# Patient Record
Sex: Male | Born: 1980 | Hispanic: Yes | Marital: Married
Health system: Southern US, Community
[De-identification: ages and names within clinical notes are randomized; demographics above are authoritative.]

---

## 2019-09-12 ENCOUNTER — Ambulatory Visit: Payer: Self-pay | Attending: Internal Medicine

## 2019-09-12 DIAGNOSIS — Z20822 Contact with and (suspected) exposure to covid-19: Secondary | ICD-10-CM | POA: Insufficient documentation

## 2019-09-13 LAB — NOVEL CORONAVIRUS, NAA: SARS-CoV-2, NAA: NOT DETECTED

## 2019-09-13 LAB — SARS-COV-2, NAA 2 DAY TAT

## 2020-10-08 ENCOUNTER — Other Ambulatory Visit: Payer: Self-pay

## 2020-10-08 ENCOUNTER — Emergency Department (HOSPITAL_COMMUNITY): Payer: BC Managed Care – PPO

## 2020-10-08 ENCOUNTER — Emergency Department (HOSPITAL_COMMUNITY)
Admission: EM | Admit: 2020-10-08 | Discharge: 2020-10-08 | Disposition: A | Discharge status: Home / Self Care | Payer: BC Managed Care – PPO | Attending: Emergency Medicine | Admitting: Emergency Medicine

## 2020-10-08 ENCOUNTER — Encounter (HOSPITAL_COMMUNITY): Payer: Self-pay | Admitting: *Deleted

## 2020-10-08 DIAGNOSIS — R300 Dysuria: Secondary | ICD-10-CM | POA: Diagnosis present

## 2020-10-08 DIAGNOSIS — N1330 Unspecified hydronephrosis: Secondary | ICD-10-CM | POA: Insufficient documentation

## 2020-10-08 DIAGNOSIS — N200 Calculus of kidney: Secondary | ICD-10-CM | POA: Diagnosis not present

## 2020-10-08 LAB — COMPREHENSIVE METABOLIC PANEL
ALT: 32 U/L (ref 0–44)
AST: 20 U/L (ref 15–41)
Albumin: 4.3 g/dL (ref 3.5–5.0)
Alkaline Phosphatase: 81 U/L (ref 38–126)
Anion gap: 7 (ref 5–15)
BUN: 19 mg/dL (ref 6–20)
CO2: 25 mmol/L (ref 22–32)
Calcium: 9.3 mg/dL (ref 8.9–10.3)
Chloride: 105 mmol/L (ref 98–111)
Creatinine, Ser: 0.96 mg/dL (ref 0.61–1.24)
GFR, Estimated: >60 mL/min (ref >60)
Glucose, Bld: 113 mg/dL — ABNORMAL HIGH (ref 70–99)
Potassium: 3.9 mmol/L (ref 3.5–5.1)
Sodium: 137 mmol/L (ref 135–145)
Total Bilirubin: 0.8 mg/dL (ref 0.3–1.2)
Total Protein: 7.2 g/dL (ref 6.5–8.1)

## 2020-10-08 LAB — CBC
HCT: 46.3 % (ref 39.0–52.0)
Hemoglobin: 15.5 g/dL (ref 13.0–17.0)
MCH: 29.9 pg (ref 26.0–34.0)
MCHC: 33.5 g/dL (ref 30.0–36.0)
MCV: 89.4 fL (ref 80.0–100.0)
Platelets: 276 10*3/uL (ref 150–400)
RBC: 5.18 MIL/uL (ref 4.22–5.81)
RDW: 12 % (ref 11.5–15.5)
WBC: 8.5 10*3/uL (ref 4.0–10.5)
nRBC: 0 % (ref 0.0–0.2)

## 2020-10-08 LAB — URINALYSIS, ROUTINE W REFLEX MICROSCOPIC
Bilirubin Urine: NEGATIVE
Glucose, UA: NEGATIVE mg/dL
Ketones, ur: NEGATIVE mg/dL
Leukocytes,Ua: NEGATIVE
Nitrite: NEGATIVE
Protein, ur: 30 mg/dL — AB
RBC / HPF: >50 RBC/hpf — ABNORMAL HIGH (ref 0–5)
Specific Gravity, Urine: 1.025 (ref 1.005–1.030)
pH: 5 (ref 5.0–8.0)

## 2020-10-08 LAB — LIPASE, BLOOD: Lipase: 72 U/L — ABNORMAL HIGH (ref 11–51)

## 2020-10-08 MED ORDER — HYDROCODONE-ACETAMINOPHEN 5-325 MG PO TABS: 1.0000 | ORAL_TABLET | Freq: Four times a day (QID) | ORAL | 0 refills | Status: AC | PRN

## 2020-10-08 MED ORDER — ONDANSETRON HCL 4 MG PO TABS: 4.0000 mg | ORAL_TABLET | Freq: Three times a day (TID) | ORAL | 0 refills | Status: AC | PRN

## 2020-10-08 MED ORDER — TAMSULOSIN HCL 0.4 MG PO CAPS: 0.4000 mg | ORAL_CAPSULE | Freq: Every day | ORAL | 0 refills | Status: AC | PRN

## 2020-10-08 NOTE — ED Provider Notes (Signed)
[facility] EMERGENCY DEPARTMENT Provider Note   CSN: [id number] Arrival date & time: 10/08/20  0151     History Chief Complaint  Patient presents with   Abdominal Pain    Morgon Pamer Sr. is a 40 y.o. male.  HPI  40 year old otherwise healthy male presents the emergency department with dysuria, hematuria and right-sided flank pain that woke him up.  No history of kidney stones, states he never had the symptoms before.  No previous surgeries in the abdomen.  Denies any fever or worsening back pain.  History reviewed. No pertinent past medical history.  There are no problems to display for this patient.   History reviewed. No pertinent surgical history.     No family history on file.     Home Medications Prior to Admission medications   Medication Sig Start Date End Date Taking? Authorizing Provider  Cholecalciferol (VITAMIN D) 50 MCG (2000 UT) tablet Take 2,000 Units by mouth daily.   Yes [provider]  HYDROcodone-acetaminophen (NORCO) 5-325 MG tablet Take 1 tablet by mouth every 6 (six) hours as needed for up to 3 days for moderate pain. 10/08/20 10/11/20 Yes Sion Thane, Clabe Seal, DO  meloxicam (MOBIC) 7.5 MG tablet Take 7.5 mg by mouth daily as needed for pain. 07/14/20  Yes [provider]  multivitamin (ONE-A-DAY MEN'S) TABS tablet Take 1 tablet by mouth daily.   Yes [provider]  ondansetron (ZOFRAN) 4 MG tablet Take 1 tablet (4 mg total) by mouth every 8 (eight) hours as needed for nausea or vomiting. 10/08/20  Yes Tilley Faeth, Clabe Seal, DO  sulfamethoxazole-trimethoprim (BACTRIM DS) 800-160 MG tablet Take 1 tablet by mouth See admin instructions. Bid x 7 days 10/07/20  Yes [provider]  zinc gluconate 50 MG tablet Take 50 mg by mouth daily.   Yes [provider]  tamsulosin (FLOMAX) 0.4 MG CAPS capsule Take 1 capsule (0.4 mg total) by mouth daily as needed. 10/08/20   Charlese Gruetzmacher, Clabe Seal, DO    Allergies     Pineapple  Review of Systems   Review of Systems  Constitutional:  Negative for chills and fever.  HENT:  Negative for congestion.   Eyes:  Negative for visual disturbance.  Respiratory:  Negative for shortness of breath.   Cardiovascular:  Negative for chest pain.  Gastrointestinal:  Negative for abdominal pain, diarrhea and vomiting.  Genitourinary:  Positive for dysuria, flank pain and hematuria. Negative for difficulty urinating.  Musculoskeletal:  Negative for back pain.  Skin:  Negative for rash.  Neurological:  Negative for headaches.   Physical Exam Updated Vital Signs BP 132/78 (BP Location: Left Arm)   Pulse 72   Temp 97.8 F (36.6 C) (Oral)   Resp 20   Ht 5\' 10"  (1.778 m)   Wt 98.4 kg   SpO2 98%   BMI 31.14 kg/m   Physical Exam Vitals and nursing note reviewed.  Constitutional:      Appearance: Normal appearance.  HENT:     Head: Normocephalic.     Mouth/Throat:     Mouth: Mucous membranes are moist.  Cardiovascular:     Rate and Rhythm: Normal rate.  Pulmonary:     Effort: Pulmonary effort is normal. No respiratory distress.  Abdominal:     Palpations: Abdomen is soft.     Tenderness: There is no abdominal tenderness. There is no guarding.  Skin:    General: Skin is warm.  Neurological:     Mental Status: He  is alert and oriented to person, place, and time. Mental status is at baseline.  Psychiatric:        Mood and Affect: Mood normal.    ED Results / Procedures / Treatments   Labs (all labs ordered are listed, but only abnormal results are displayed) Labs Reviewed  LIPASE, BLOOD - Abnormal; Notable for the following components:      Result Value   Lipase 72 (*)    All other components within normal limits  COMPREHENSIVE METABOLIC PANEL - Abnormal; Notable for the following components:   Glucose, Bld 113 (*)    All other components within normal limits  URINALYSIS, ROUTINE W REFLEX MICROSCOPIC - Abnormal; Notable for the following  components:   APPearance HAZY (*)    Hgb urine dipstick LARGE (*)    Protein, ur 30 (*)    RBC / HPF >50 (*)    Bacteria, UA RARE (*)    All other components within normal limits  CBC    EKG None  Radiology CT Renal Stone Study  Result Date: 10/08/2020 CLINICAL DATA:  Lower abdominal pain for 1 day with hematuria EXAM: CT ABDOMEN AND PELVIS WITHOUT CONTRAST TECHNIQUE: Multidetector CT imaging of the abdomen and pelvis was performed following the standard protocol without IV contrast. COMPARISON:  None. FINDINGS: Lower chest:  No contributory findings. Hepatobiliary: Hepatic steatosis with central sparingno evidence of biliary obstruction or stone. Pancreas: Unremarkable. Spleen: Unremarkable. Adrenals/Urinary Tract: Negative adrenals. 5 mm stone just below the left UPJ with mild hydronephrosis. No additional urinary calculus. Unremarkable bladder. Stomach/Bowel:  No obstruction. No visible bowel inflammation. Vascular/Lymphatic: No acute vascular abnormality. No mass or adenopathy. Reproductive:Dystrophic calcifications in the prostate. Other: No ascites or pneumoperitoneum. Musculoskeletal: No acute abnormalities. IMPRESSION: 1. Mild left hydronephrosis from a 5 mm stone just below the UPJ. 2. Hepatic steatosis. Electronically Signed   By: Marnee Spring M.D.   On: 10/08/2020 04:52    Procedures Procedures   Medications Ordered in ED Medications - No data to display  ED Course  I have reviewed the triage vital signs and the nursing notes.  Pertinent labs & imaging results that were available during my care of the patient were reviewed by me and considered in my medical decision making (see chart for details).    MDM Rules/Calculators/A&P                          40 year old male presents emergency department dysuria, hematuria and flank pain.  Blood work shows no leukocytosis, normal kidney dysfunction, blood in the urine but no urinary tract infection.  CAT scan identifies a 5  mm stone with mild hydronephrosis but no other acute findings.  Patient is tolerating p.o., afebrile, otherwise looks well.  We will treat symptomatically as an outpatient.  Patient will be discharged and treated as an outpatient.  Discharge plan and strict return to ED precautions discussed, patient verbalizes understanding and agreement.   Final Clinical Impression(s) / ED Diagnoses Final diagnoses:  Kidney stone    Rx / DC Orders ED Discharge Orders          Ordered    HYDROcodone-acetaminophen (NORCO) 5-325 MG tablet  Every 6 hours PRN        10/08/20 1427    ondansetron (ZOFRAN) 4 MG tablet  Every 8 hours PRN        10/08/20 1427    tamsulosin (FLOMAX) 0.4 MG CAPS capsule  Daily PRN  10/08/20 1427             Kodi Guerrera, Clabe Seal, DO 10/08/20 1438

## 2020-10-08 NOTE — ED Provider Notes (Signed)
Emergency Medicine Provider Triage Evaluation Note  Alexander Soy Sr. , a 40 y.o. male  was evaluated in triage.  Pt complains of left flank pain. Having urinary frequency/hematuria, tonight woke up with left flank pain, now has moved to the bladder area.   Review of Systems  Positive: Flank/abdominal pain, frequency, hematuria Negative: Fever, vomiting, dysuria.   Physical Exam  BP 132/90   Pulse 90   Temp 97.9 F (36.6 C) (Oral)   Resp 15   Ht 5\' 10"  (1.778 m)   Wt 98.4 kg   SpO2 96%   BMI 31.14 kg/m  Gen:   Awake, no distress   Resp:  Normal effort  MSK:   Moves extremities without difficulty  Other:  Abdominal exam: Nontender, no peritoneal signs.   Medical Decision Making  Medically screening exam initiated at 5:14 AM.  Appropriate orders placed.  Alexander Pauwels Sr. was informed that the remainder of the evaluation will be completed by another provider, this initial triage assessment does not replace that evaluation, and the importance of remaining in the ED until their evaluation is complete.  Flank pain.    Alexander Repress, PA-C 10/08/20 0516    10/10/20, MD 10/08/20 [phone]

## 2020-10-08 NOTE — ED Triage Notes (Signed)
The pt had bloody urine yesterday and had urinary frequency  tonight he was awakened from sleep with the flank pain

## 2020-10-08 NOTE — Discharge Instructions (Addendum)
You have been seen and discharged from the emergency department.  You have been diagnosed with a left-sided kidney stone.  No signs of infection or obstruction.  Take prescriptions as directed.  You may use Tylenol/ibuprofen for pain control, you can take the stronger pain medicine as needed.  Do not mix this medication with alcohol or other sedating medications. Do not drive or do heavy physical activity and to know how this medication affects you.  It may cause drowsiness.  Follow-up with your primary provider for reevaluation and further care. Take home medications as prescribed. If you have any worsening symptoms or further concerns for your health please return to an emergency department for further evaluation.

## 2022-09-11 IMAGING — CT CT RENAL STONE PROTOCOL
2 of 8 series · 14 of 46 positions shown, 19 images · non-contrast
Comparison: None.

CLINICAL DATA: Lower abdominal pain for 1 day with hematuria

EXAM:
CT ABDOMEN AND PELVIS WITHOUT CONTRAST
TECHNIQUE: Multidetector CT imaging of the abdomen and pelvis was performed
following the standard protocol without IV contrast.

[Series 3: ap without · axial · non-contrast · 0.74mm/px · z∈[-514,-110]mm · 11 of 97 slices shown, 16 images]
[im 8/97  soft-tissue]
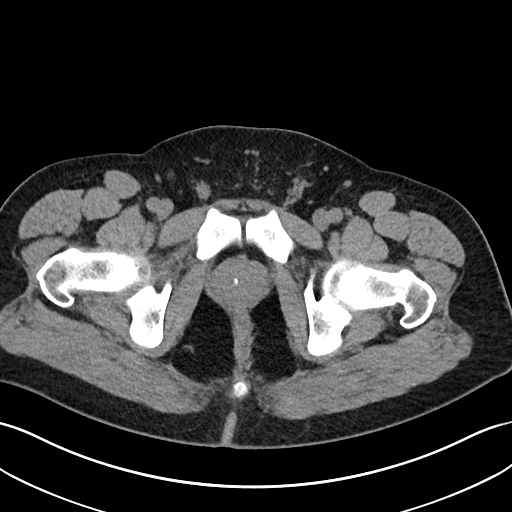
[im 8/97  bone]
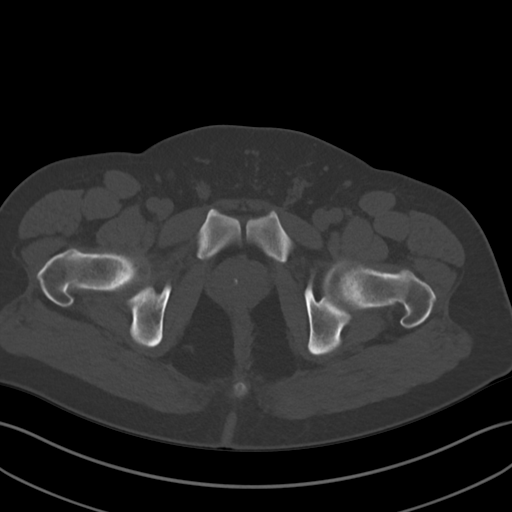
[im 15/97  soft-tissue]
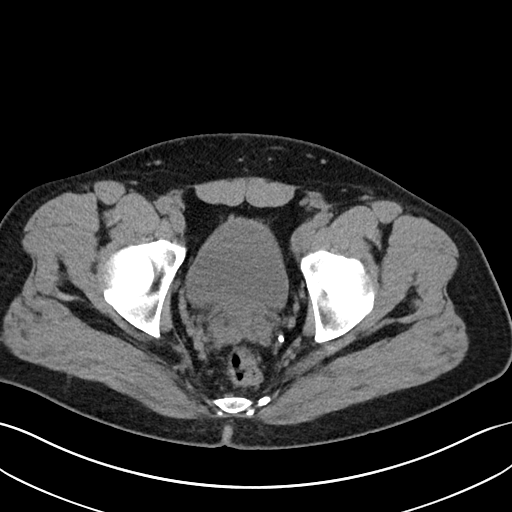
[im 30/97  soft-tissue]
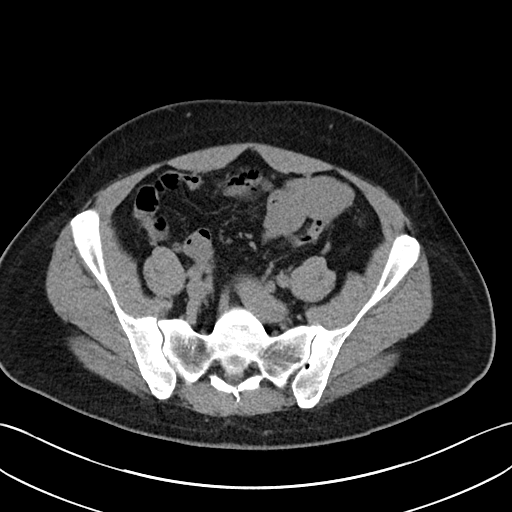
[im 37/97  soft-tissue]
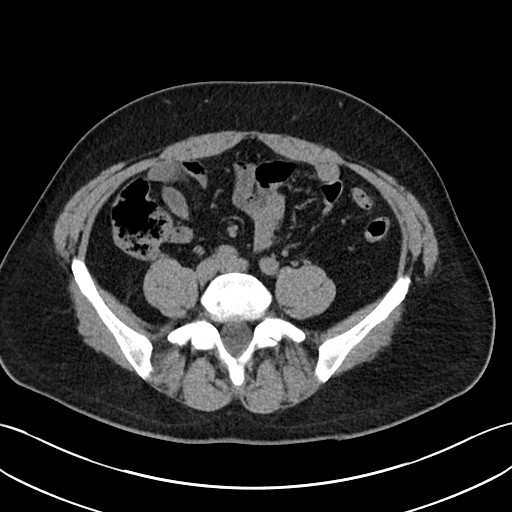
[im 45/97  soft-tissue]
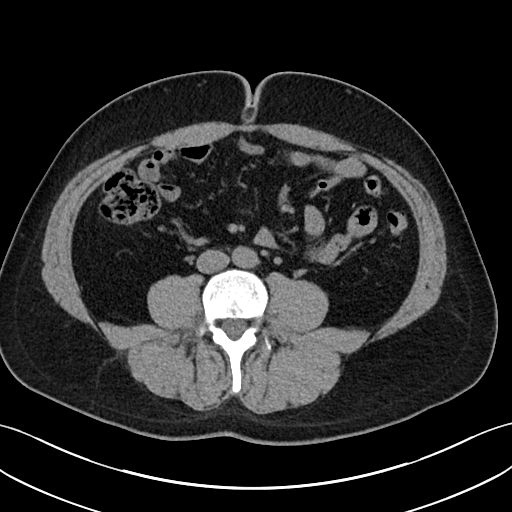
[im 52/97  soft-tissue]
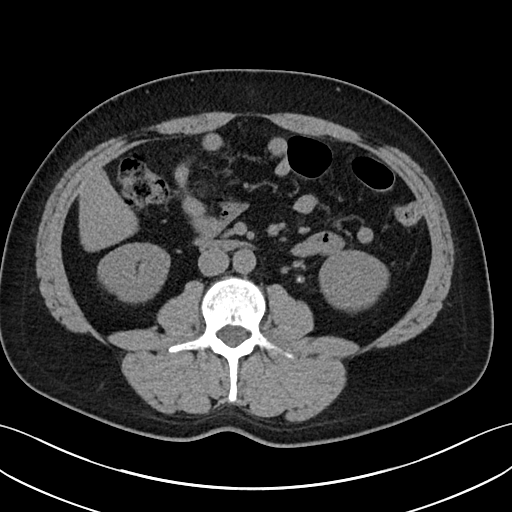
[im 60/97  soft-tissue]
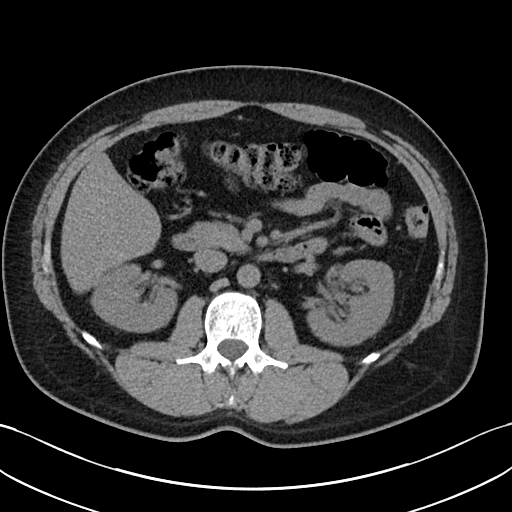
[im 67/97  lung]
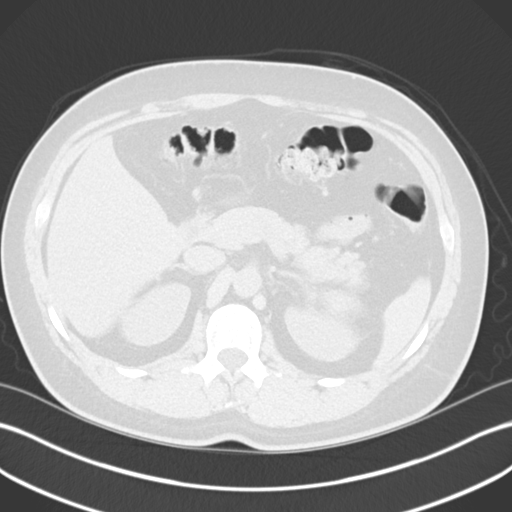
[im 74/97  soft-tissue]
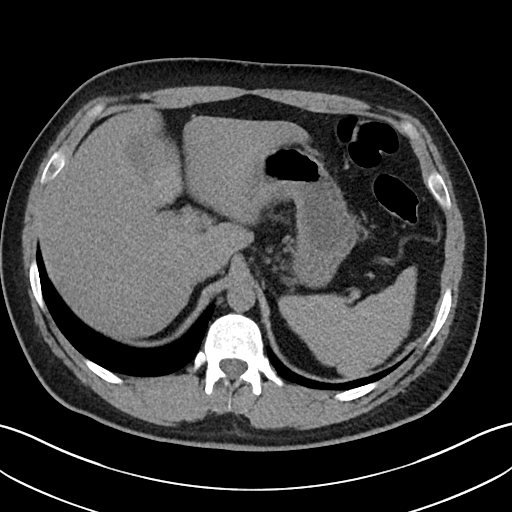
[im 74/97  lung]
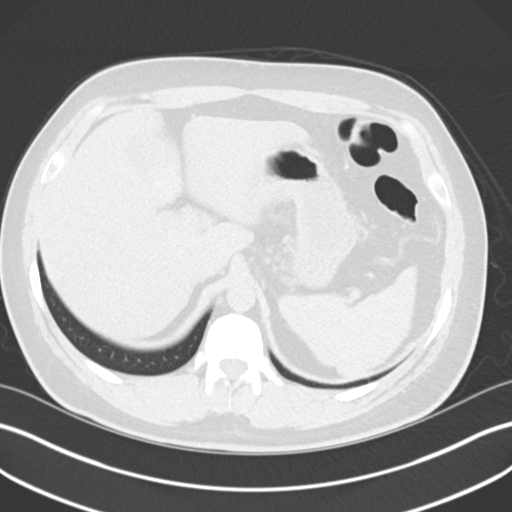
[im 82/97  soft-tissue]
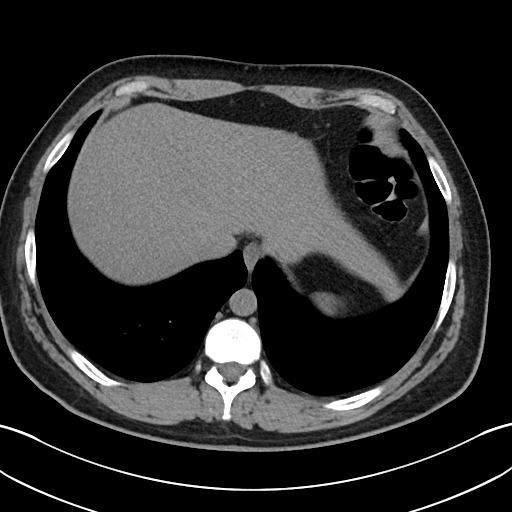
[im 82/97  lung]
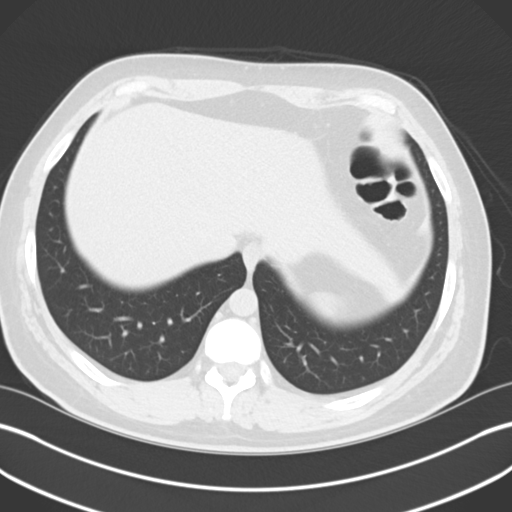
[im 82/97  bone]
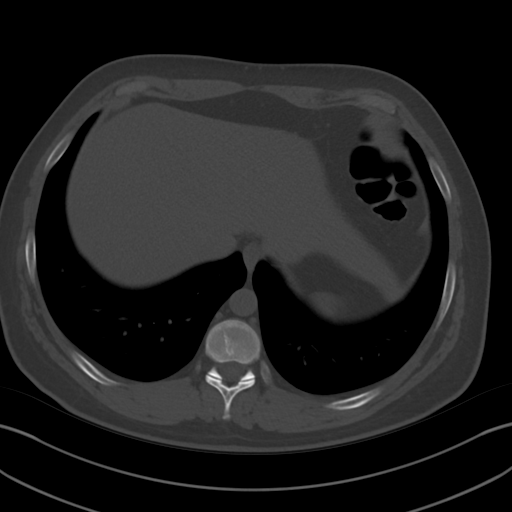
[im 89/97  soft-tissue]
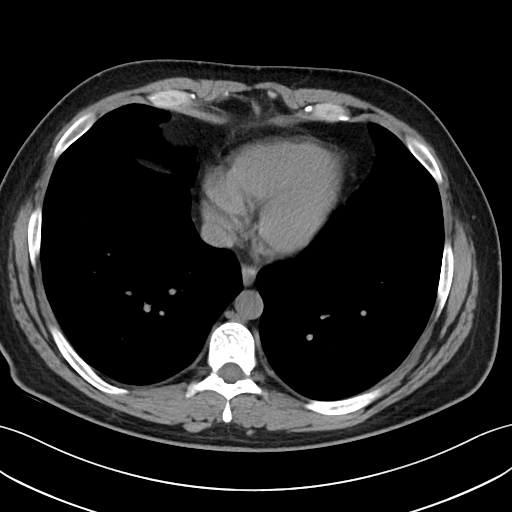
[im 89/97  lung]
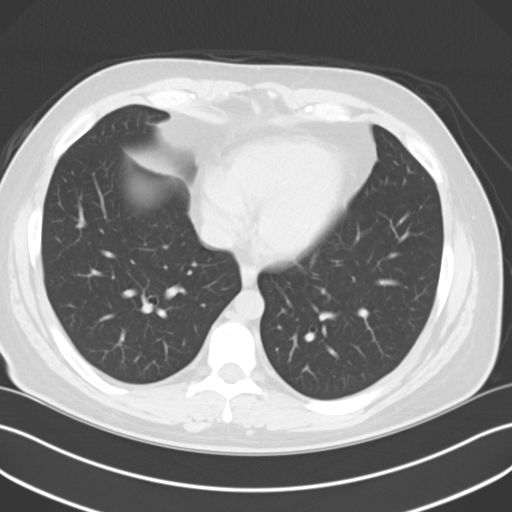

[Series 6: cor · coronal · 0.82mm/px · 3 of 111 slices shown]
[im 23/111  soft-tissue]
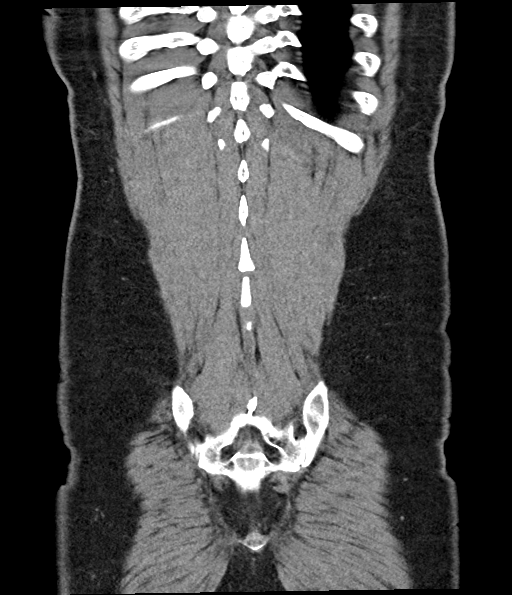
[im 45/111  soft-tissue]
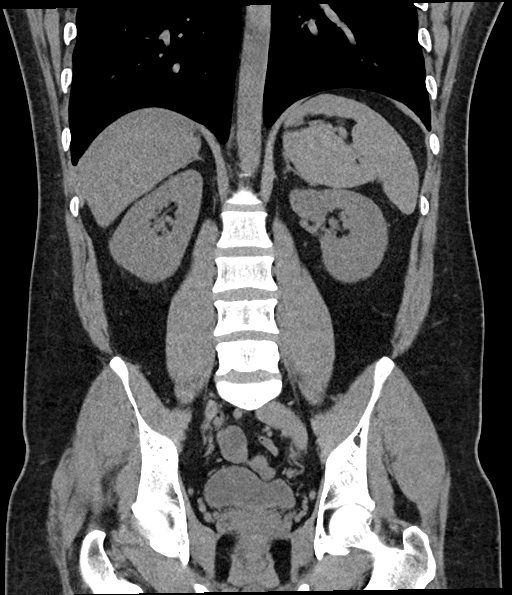
[im 67/111  soft-tissue]
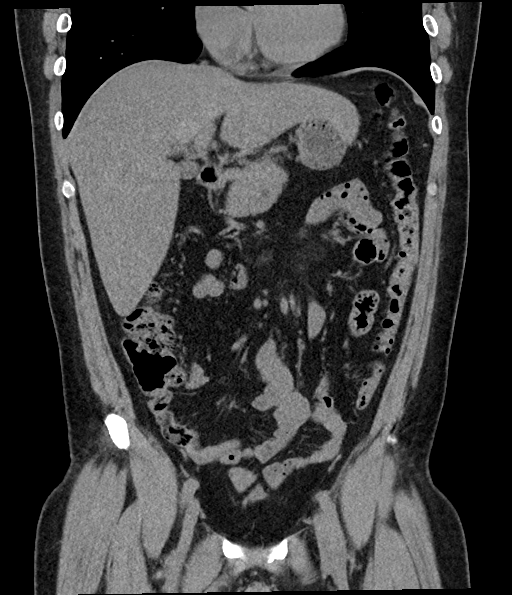

[14 of 46 positions shown; findings below may reference images not displayed]

FINDINGS: Lower chest:  No contributory findings.

Hepatobiliary: Hepatic steatosis with central sparingno evidence of
biliary obstruction or stone.

Pancreas: Unremarkable.

Spleen: Unremarkable.

Adrenals/Urinary Tract: Negative adrenals. 5 mm stone just below the
left UPJ with mild hydronephrosis. No additional urinary calculus.
Unremarkable bladder.

Stomach/Bowel:  No obstruction. No visible bowel inflammation.

Vascular/Lymphatic: No acute vascular abnormality. No mass or
adenopathy.

Reproductive:Dystrophic calcifications in the prostate.

Other: No ascites or pneumoperitoneum.

Musculoskeletal: No acute abnormalities.
IMPRESSION: 1. Mild left hydronephrosis from a 5 mm stone just below the UPJ.
2. Hepatic steatosis.

## 2023-12-21 LAB — LAB REPORT - SCANNED: EGFR (Non-African Amer.): 126

## 2023-12-29 ENCOUNTER — Encounter: Payer: Self-pay | Admitting: Skilled Nursing Facility1

## 2023-12-29 ENCOUNTER — Encounter: Attending: Internal Medicine | Admitting: Skilled Nursing Facility1

## 2023-12-29 VITALS — Ht 69.0 in | Wt 209.1 lb

## 2023-12-29 DIAGNOSIS — E669 Obesity, unspecified: Secondary | ICD-10-CM | POA: Diagnosis present

## 2023-12-29 NOTE — Progress Notes (Addendum)
 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 Pt states he also would like to discuss losing weight. 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 Pt states he struggles with intermittent diarrhea and constipation. Pt states he has had an endoscopy and colonoscopy and been tested for bacteria and viral infection and tested for celiac all negative.  Pt states he does not eat vegetables very often.  Pt states he does take a probiotic but it is not refrigerated. Pt states he is not confident he has lactose intolerance.    Body Composition Scale 12/29/2023  Current Body Weight 209.1  Total Body Fat % 24.7  Visceral Fat 15  Fat-Free Mass % 75.2   Total Body Water % 56.2  Muscle-Mass lbs 39.3  BMI 30.6  Body Fat Displacement          Torso  lbs 31.9         Left Leg  lbs 6.3         Right Leg  lbs 6.3         Left Arm  lbs 3.1         Right Arm   lbs 3.1     Estimated daily fluid intake:  oz Supplements: vitamin D, vitamin B12 Sleep:  Stress / self-care: due to diarrhea  Current average weekly physical activity: ADL's  24-Hr Dietary Recall: watermelon, blueberries, mangos, oranges, banana First Meal: 3 eggs + water or black coffee Snack: coconut yogurt + 90% dark chocolate  Second Meal: sandwich from a cafeteria and chips  Snack: gummy candies  Third Meal: frozen fish sticks + yogurt  Snack: candy Beverages: 50 oz black coffees, water 2 bottles   NUTRITION INTERVENTION  Nutrition education (E-1) on the following topics:  Role of soluble fiber in diarrhea  management Identifying and avoiding common dietary triggers Portable nutrition options while on the road Importance of hydration and electrolyte balance  Handouts Provided Include  Lists of soluble fiber foods  Learning Style & Readiness for Change Teaching method utilized: Visual & Auditory  Demonstrated degree of understanding via: Teach Back  Barriers to learning/adherence to lifestyle change: traveling most months out of year  Goals Established by Pt Drink 4 bottles of water at least per day ?? Shelf-Stable & Easy-to-Digest Staples Food Why It's Good Notes Instant oatmeal packets Rich in soluble fiber Choose plain or low-sugar Microwavable brown or white rice cups Easy to digest carb Pair with canned proteins Plain instant mashed potatoes Bland, binding food Mix with low-fat broth Canned pumpkin (plain) High in soluble fiber Add to oatmeal or rice Banana chips (unsweetened) Gentle on gut Natural source of potassium Low-sugar applesauce cups Soluble fiber, calming Individual packs are easy to carry Saltine crackers or rice cakes Binding starch Good during active diarrhea ?? Shelf-Stable Proteins Food Why It's Good Notes Canned chicken or tuna (in water) Lean, low-fat protein Look for low-sodium options Microwavable lentil pouches Fiber + protein Choose low-spice options Plain tofu (vacuum-sealed) Easy on the stomach Can be microwaved with rice Egg white  microwave cups Quick protein Found in refrigerated section Powdered protein (low FODMAP) Nutritional backup Mix with water or lactose-free milk ?? Refrigerated Hotel-Microwave-Safe Foods (Buy on the Road) Food Why It's Good Notes Greek yogurt (plain, low-fat) Probiotics + protein Choose lactose-free if sensitive Boiled eggs Easy protein Keep chilled in mini fridge Lactose-free milk or soy milk For protein & smoothies Store in small cartons Shredded rotisserie chicken Mild and lean Add to instant rice or potatoes ?? Hydration +  Electrolyte Support Product Why It's Good Notes Electrolyte drink powders (e.g., Liquid I.V., LMNT, DripDrop) Prevent dehydration from diarrhea Pack single-serve sticks Bone broth packets or cups Hydration + minerals Microwave in hotel mug ?? Snack Ideas (Gut-Friendly) Snack Why It's Good Peanut butter (individual packs) Healthy fats & protein Low-fiber granola bars (e.g., KIND Oats & Honey) Gentle on digestion Plain rice cereal Easy carb option Chia pudding cups (store-bought) Soluble fiber (only if well-tolerated) Tips for Managing Diarrhea While Traveling Avoid: greasy food, caffeine, raw veggies, and alcohol. Choose soluble fiber sources (e.g., oats, bananas, applesauce, rice). Stay hydrated--carry a refillable water bottle. Bring emergency antidiarrheals (e.g., loperamide) and probiotics (Florajen, Culturelle, or shelf-stable ones). Get a B1 from the pharmacy downstairs and start taking 100mg  daily   MONITORING & EVALUATION Dietary intake, weekly physical activity  Next Steps  Patient is to return the first week of October.

## 2024-01-20 ENCOUNTER — Encounter: Payer: Self-pay | Admitting: Neurology

## 2024-02-01 ENCOUNTER — Encounter: Attending: Internal Medicine | Admitting: Skilled Nursing Facility1

## 2024-02-01 ENCOUNTER — Encounter: Payer: Self-pay | Admitting: Skilled Nursing Facility1

## 2024-02-01 DIAGNOSIS — E669 Obesity, unspecified: Secondary | ICD-10-CM | POA: Diagnosis present

## 2024-02-01 NOTE — Progress Notes (Signed)
 Medical Nutrition Therapy  Appointment Start time: 2:04  Appointment End time: 2:40  Primary concerns today: 9 months of diarrhea; weight loss Referral diagnosis: e66.9   NUTRITION ASSESSMENT    Clinical Medical Hx:  Medications: see list Labs: WNL, vitamin D 21 Notable Signs/Symptoms: diarrhea, constipation   Lifestyle & Dietary Hx  Pt arrives no longer experincing diarrhea.   Pt states he was feeling weak prior to previous appt but now feels more constant energy with no energy drops in the afternoon. Pt states he has been eating a lot more options lately. Pt states he does find he feels better. Pt states he reduced alcohol, coffee, and increased his water intake. Pt states he Adds electrolytes to one of his water bottles. Pt states he has been sleeping 7 hours a night and going to bed at an earlier time. Pt states he does have lactose intolerance but it does not take it until the next day instead of causing immediate issues. Pt states he takes imodium when he is feeling anxious which has really helped. Pt states he loves the lentils and has been eating more beans.  Pt states he has been cautious with fruits.   Pt states he has not lost any weight and would like to continue to work on that.     Body Composition Scale 12/29/2023  Current Body Weight 209.1  Total Body Fat % 24.7  Visceral Fat 15  Fat-Free Mass % 75.2   Total Body Water % 56.2  Muscle-Mass lbs 39.3  BMI 30.6  Body Fat Displacement          Torso  lbs 31.9         Left Leg  lbs 6.3         Right Leg  lbs 6.3         Left Arm  lbs 3.1         Right Arm   lbs 3.1    Estimated daily fluid intake:  oz Supplements: vitamin D, vitamin B12 Sleep:  Stress / self-care: due to diarrhea  Current average weekly physical activity: ADL's  24-Hr Dietary Recall: watermelon, blueberries, mangos, oranges, banana First Meal: 4 eggs Snack 9-10: granola bar or coconut yogurt with 90% dark chocolate and blueberries   Second Meal: steak with mushrooms and salad and fries  Snack: gummy candies or chips Third Meal 7pm: chicken + rice + salad  Snack: candy Beverages: black coffees, water 3 large bottles   NUTRITION INTERVENTION  Nutrition education (E-1) on the following topics:  Role of soluble fiber in diarrhea management Identifying and avoiding common dietary triggers Portable nutrition options while on the road Importance of hydration and electrolyte balance  Handouts Provided Include  Lists of soluble fiber foods  Learning Style & Readiness for Change Teaching method utilized: Visual & Auditory  Demonstrated degree of understanding via: Teach Back  Barriers to learning/adherence to lifestyle change: traveling most months out of year  Goals Established by Pt Finish out the B1 bottle and discontinue now that diarrhea has subsided  Try apples and pears again When eating out avoid fried foods, butters, and cream sauces and ensure non starchy vegetables are present in the meal Continue to work on stopping at satisfaction rather than physically full Ask your wife to serve you smaller protions of rice  MONITORING & EVALUATION Dietary intake, weekly physical activity  Next Steps  Patient is to return after his travels to continue to work on weight loss

## 2024-03-30 ENCOUNTER — Ambulatory Visit: Admitting: Skilled Nursing Facility1

## 2024-04-11 ENCOUNTER — Ambulatory Visit: Admitting: Neurology

## 2024-05-16 ENCOUNTER — Encounter: Admitting: Skilled Nursing Facility1

## 2024-06-01 ENCOUNTER — Encounter: Admitting: Skilled Nursing Facility1
